# Patient Record
Sex: Male | Born: 1985
Health system: Southern US, Community
[De-identification: ages and names within clinical notes are randomized; demographics above are authoritative.]

---

## 2019-09-27 ENCOUNTER — Emergency Department (HOSPITAL_BASED_OUTPATIENT_CLINIC_OR_DEPARTMENT_OTHER)
Admission: EM | Admit: 2019-09-27 | Discharge: 2019-09-27 | Disposition: A | Discharge status: Home / Self Care | Payer: BC Managed Care – PPO | Attending: Emergency Medicine | Admitting: Emergency Medicine

## 2019-09-27 ENCOUNTER — Encounter (HOSPITAL_BASED_OUTPATIENT_CLINIC_OR_DEPARTMENT_OTHER): Payer: Self-pay | Admitting: *Deleted

## 2019-09-27 ENCOUNTER — Other Ambulatory Visit: Payer: Self-pay

## 2019-09-27 DIAGNOSIS — Y9389 Activity, other specified: Secondary | ICD-10-CM | POA: Diagnosis not present

## 2019-09-27 DIAGNOSIS — Y998 Other external cause status: Secondary | ICD-10-CM | POA: Diagnosis not present

## 2019-09-27 DIAGNOSIS — Y92009 Unspecified place in unspecified non-institutional (private) residence as the place of occurrence of the external cause: Secondary | ICD-10-CM | POA: Insufficient documentation

## 2019-09-27 DIAGNOSIS — E860 Dehydration: Secondary | ICD-10-CM | POA: Diagnosis not present

## 2019-09-27 DIAGNOSIS — S3739XA Other injury of urethra, initial encounter: Secondary | ICD-10-CM | POA: Diagnosis not present

## 2019-09-27 DIAGNOSIS — R3911 Hesitancy of micturition: Secondary | ICD-10-CM | POA: Insufficient documentation

## 2019-09-27 DIAGNOSIS — X58XXXA Exposure to other specified factors, initial encounter: Secondary | ICD-10-CM | POA: Diagnosis not present

## 2019-09-27 LAB — COMPREHENSIVE METABOLIC PANEL
ALT: 23 U/L (ref 0–44)
AST: 14 U/L — ABNORMAL LOW (ref 15–41)
Albumin: 4.4 g/dL (ref 3.5–5.0)
Alkaline Phosphatase: 66 U/L (ref 38–126)
Anion gap: 10 (ref 5–15)
BUN: 16 mg/dL (ref 6–20)
CO2: 27 mmol/L (ref 22–32)
Calcium: 9.5 mg/dL (ref 8.9–10.3)
Chloride: 101 mmol/L (ref 98–111)
Creatinine, Ser: 1.17 mg/dL (ref 0.61–1.24)
GFR calc Af Amer: >60 mL/min (ref >60)
GFR calc non Af Amer: >60 mL/min (ref >60)
Glucose, Bld: 128 mg/dL — ABNORMAL HIGH (ref 70–99)
Potassium: 3.9 mmol/L (ref 3.5–5.1)
Sodium: 138 mmol/L (ref 135–145)
Total Bilirubin: 0.9 mg/dL (ref 0.3–1.2)
Total Protein: 7.8 g/dL (ref 6.5–8.1)

## 2019-09-27 LAB — CBC WITH DIFFERENTIAL/PLATELET
Abs Immature Granulocytes: 0.02 10*3/uL (ref 0.00–0.07)
Basophils Absolute: 0 10*3/uL (ref 0.0–0.1)
Basophils Relative: 1 %
Eosinophils Absolute: 0.1 10*3/uL (ref 0.0–0.5)
Eosinophils Relative: 2 %
HCT: 48 % (ref 39.0–52.0)
Hemoglobin: 16.1 g/dL (ref 13.0–17.0)
Immature Granulocytes: 0 %
Lymphocytes Relative: 32 %
Lymphs Abs: 1.9 10*3/uL (ref 0.7–4.0)
MCH: 30.6 pg (ref 26.0–34.0)
MCHC: 33.5 g/dL (ref 30.0–36.0)
MCV: 91.3 fL (ref 80.0–100.0)
Monocytes Absolute: 0.5 10*3/uL (ref 0.1–1.0)
Monocytes Relative: 8 %
Neutro Abs: 3.4 10*3/uL (ref 1.7–7.7)
Neutrophils Relative %: 57 %
Platelets: 221 10*3/uL (ref 150–400)
RBC: 5.26 MIL/uL (ref 4.22–5.81)
RDW: 13.1 % (ref 11.5–15.5)
WBC: 5.9 10*3/uL (ref 4.0–10.5)
nRBC: 0 % (ref 0.0–0.2)

## 2019-09-27 LAB — URINALYSIS, ROUTINE W REFLEX MICROSCOPIC
Glucose, UA: NEGATIVE mg/dL
Hgb urine dipstick: NEGATIVE
Ketones, ur: >80 mg/dL — AB
Leukocytes,Ua: NEGATIVE
Nitrite: NEGATIVE
Protein, ur: NEGATIVE mg/dL
Specific Gravity, Urine: 1.025 (ref 1.005–1.030)
pH: 6 (ref 5.0–8.0)

## 2019-09-27 LAB — CK: Total CK: 101 U/L (ref 49–397)

## 2019-09-27 MED ORDER — SODIUM CHLORIDE 0.9 % IV BOLUS
1000.0000 mL | Freq: Once | INTRAVENOUS | Status: AC
  Administered 2019-09-27: 1000 mL via INTRAVENOUS

## 2019-09-27 NOTE — ED Provider Notes (Signed)
[facility] EMERGENCY DEPARTMENT Provider Note   CSN: [id number] Arrival date & time: 09/27/19  1304     History No chief complaint on file.   Darren Cruz is a 34 y.o. male.  HPI Patient reports he has a feeling that is difficult to get his urine stream started and out.  He reports he gets to feeling an urgency to urinate but feels like it is hard to pass urine.  He reports that yesterday he, fell getting out of the shower and got his penis caught between something and kind of crushed it.  He reports he thinks it was erect when that happened.  He reports there was pain at the time.  He might of had some blood, to the penis then but he reports he drank some fluids and his urine has been clear since that time.  He has not had fevers or chills or abdominal pain.  He denies he has had an erection since that episode occurred.  He reports his penis is not really painful anymore may be a little uncomfortable.  No pain in the testicles.  He denies he has any history of diabetes or hypertension.  He denies flank pain.    History reviewed. No pertinent past medical history.  There are no problems to display for this patient.   History reviewed. No pertinent surgical history.     No family history on file.  Social History   Tobacco Use  . Smoking status: Never Smoker  . Smokeless tobacco: Never Used  Substance Use Topics  . Alcohol use: Not Currently  . Drug use: Never    Home Medications Prior to Admission medications   Not on File    Allergies    Patient has no known allergies.  Review of Systems   Review of Systems  10 systems reviewed and negative except as per HPI  Physical Exam Updated Vital Signs BP 117/69 (BP Location: Right Arm)   Pulse 80   Temp 98.7 F (37.1 C) (Oral)   Resp 18   Ht 6\' 3"  (1.905 m)   Wt (!) 102.1 kg   SpO2 99%   BMI 28.12 kg/m   Physical Exam Constitutional:      Appearance: Normal appearance.  Eyes:     Extraocular  Movements: Extraocular movements intact.     Conjunctiva/sclera: Conjunctivae normal.  Cardiovascular:     Rate and Rhythm: Normal rate and regular rhythm.  Pulmonary:     Effort: Pulmonary effort is normal.     Breath sounds: Normal breath sounds.  Abdominal:     General: There is no distension.     Palpations: Abdomen is soft.     Tenderness: There is no abdominal tenderness. There is no guarding.  Genitourinary:    Comments: No inguinal tenderness or mass.  Penis normal.  No ecchymoses or areas of hematoma.  Glans normal with normal urethral meatus.  No appearance of tear or any blood or drainage from the meatus.  Testicles are nontender and smooth.  No scrotal edema. Musculoskeletal:        General: No swelling. Normal range of motion.     Right lower leg: No edema.     Left lower leg: No edema.  Skin:    General: Skin is warm and dry.  Neurological:     General: No focal deficit present.     Mental Status: He is alert and oriented to person, place, and time.     Coordination: Coordination  normal.  Psychiatric:        Mood and Affect: Mood normal.     ED Results / Procedures / Treatments   Labs (all labs ordered are listed, but only abnormal results are displayed) Labs Reviewed  URINALYSIS, ROUTINE W REFLEX MICROSCOPIC - Abnormal; Notable for the following components:      Result Value   Color, Urine AMBER (*)    Bilirubin Urine SMALL (*)    Ketones, ur >80 (*)    All other components within normal limits  COMPREHENSIVE METABOLIC PANEL - Abnormal; Notable for the following components:   Glucose, Bld 128 (*)    AST 14 (*)    All other components within normal limits  CBC WITH DIFFERENTIAL/PLATELET  CK  RPR  GC/CHLAMYDIA PROBE AMP ([place]) NOT AT [facility]    EKG None  Radiology No results found.  Procedures Procedures (including critical care time)  Medications Ordered in ED Medications  sodium chloride 0.9 % bolus 1,000 mL (1,000 mLs Intravenous New  Bag/Given 09/27/19 1809)    ED Course  I have reviewed the triage vital signs and the nursing notes.  Pertinent labs & imaging results that were available during my care of the patient were reviewed by me and considered in my medical decision making (see chart for details).    MDM Rules/Calculators/A&P                          Patient describes some decreased urine output and needing to strain to pass urine.  He described an episode of possible crush injury.  The penile exam is normal.  There is no evidence of a fracture.  There is no hematoma or skin changes.  Patient is nontender to exam.  Notably, urine has greater than 80 ketones.  Patient is clinically well but will check a comprehensive metabolic panel, CBC and CK to rule out any metabolic issues.  Patient is clinically well in appearance with a normal physical examination. Urinalysis did show elevated ketones. Patient is rehydrated with 1 L fluids. Normal renal function, CBC and CK. Patient described falling and injuring his penis yesterday. Physical exam of the penis is normal. There are no ecchymotic or swelling of the shaft. Shaft of penis is completely normal. I do not suspect penile fracture given no findings of bruising or swelling whatsoever. Urethral meatus is completely normal without any irritation evidence of blood or tear.  At this time, patient is safe for discharge with hydrating and rest. He is an otherwise healthy 34 year old. GC chlamydia has been added on but at this time symptoms are not highly suggestive. Will not opt to treat empirically based on history and exam.  Final Clinical Impression(s) / ED Diagnoses Final diagnoses:  Urinary hesitancy  Injury of penile urethra  Dehydration    Rx / DC Orders ED Discharge Orders    None       Arby Barrette, MD 09/27/19 1900

## 2019-09-27 NOTE — ED Triage Notes (Signed)
Difficulty getting his urinary stream started. He crushed his penis during a fall per pt.

## 2019-09-27 NOTE — Discharge Instructions (Signed)
1. You appear to be slightly dehydrated. Rest and get plenty of fluids over the next 24 hours. 2. You expressed difficulty urinating. The examination of your penis is normal at this time. Your urine does not show signs of infection. Gonorrhea and Chlamydia tests have been added onto your urine specimen, these results should be available in several days. Your examination did not suggest an infection at this time. 3. If you are feeling of urinary difficulty or penile discomfort has not resolved within the next 1 to 2 days with hydrating and rest, you should schedule a follow-up appointment with the urologist. The contact information for [facility] are included in your discharge instructions.

## 2019-09-28 LAB — GC/CHLAMYDIA PROBE AMP (~~LOC~~) NOT AT ARMC
Chlamydia: NEGATIVE
Comment: NEGATIVE
Comment: NORMAL
Neisseria Gonorrhea: NEGATIVE

## 2019-09-28 LAB — RPR: RPR Ser Ql: NONREACTIVE

## 2019-10-05 ENCOUNTER — Emergency Department (HOSPITAL_BASED_OUTPATIENT_CLINIC_OR_DEPARTMENT_OTHER): Payer: BC Managed Care – PPO

## 2019-10-05 ENCOUNTER — Encounter (HOSPITAL_BASED_OUTPATIENT_CLINIC_OR_DEPARTMENT_OTHER): Payer: Self-pay

## 2019-10-05 ENCOUNTER — Other Ambulatory Visit: Payer: Self-pay

## 2019-10-05 ENCOUNTER — Emergency Department (HOSPITAL_BASED_OUTPATIENT_CLINIC_OR_DEPARTMENT_OTHER)
Admission: EM | Admit: 2019-10-05 | Discharge: 2019-10-05 | Disposition: A | Discharge status: Home / Self Care | Payer: BC Managed Care – PPO | Attending: Emergency Medicine | Admitting: Emergency Medicine

## 2019-10-05 DIAGNOSIS — H6123 Impacted cerumen, bilateral: Secondary | ICD-10-CM | POA: Insufficient documentation

## 2019-10-05 DIAGNOSIS — J039 Acute tonsillitis, unspecified: Secondary | ICD-10-CM

## 2019-10-05 DIAGNOSIS — J029 Acute pharyngitis, unspecified: Secondary | ICD-10-CM | POA: Insufficient documentation

## 2019-10-05 LAB — CBC WITH DIFFERENTIAL/PLATELET
Abs Immature Granulocytes: 0.03 10*3/uL (ref 0.00–0.07)
Basophils Absolute: 0.1 10*3/uL (ref 0.0–0.1)
Basophils Relative: 1 %
Eosinophils Absolute: 0.1 10*3/uL (ref 0.0–0.5)
Eosinophils Relative: 1 %
HCT: 48.1 % (ref 39.0–52.0)
Hemoglobin: 15.9 g/dL (ref 13.0–17.0)
Immature Granulocytes: 0 %
Lymphocytes Relative: 21 %
Lymphs Abs: 1.8 10*3/uL (ref 0.7–4.0)
MCH: 30.1 pg (ref 26.0–34.0)
MCHC: 33.1 g/dL (ref 30.0–36.0)
MCV: 91.1 fL (ref 80.0–100.0)
Monocytes Absolute: 0.8 10*3/uL (ref 0.1–1.0)
Monocytes Relative: 9 %
Neutro Abs: 5.8 10*3/uL (ref 1.7–7.7)
Neutrophils Relative %: 68 %
Platelets: 216 10*3/uL (ref 150–400)
RBC: 5.28 MIL/uL (ref 4.22–5.81)
RDW: 13.1 % (ref 11.5–15.5)
WBC: 8.5 10*3/uL (ref 4.0–10.5)
nRBC: 0 % (ref 0.0–0.2)

## 2019-10-05 LAB — BASIC METABOLIC PANEL
Anion gap: 12 (ref 5–15)
BUN: 8 mg/dL (ref 6–20)
CO2: 28 mmol/L (ref 22–32)
Calcium: 9.7 mg/dL (ref 8.9–10.3)
Chloride: 98 mmol/L (ref 98–111)
Creatinine, Ser: 1.29 mg/dL — ABNORMAL HIGH (ref 0.61–1.24)
GFR calc Af Amer: >60 mL/min (ref >60)
GFR calc non Af Amer: >60 mL/min (ref >60)
Glucose, Bld: 100 mg/dL — ABNORMAL HIGH (ref 70–99)
Potassium: 4.4 mmol/L (ref 3.5–5.1)
Sodium: 138 mmol/L (ref 135–145)

## 2019-10-05 LAB — GROUP A STREP BY PCR: Group A Strep by PCR: NOT DETECTED

## 2019-10-05 MED ORDER — IOHEXOL 300 MG/ML  SOLN
100.0000 mL | Freq: Once | INTRAMUSCULAR | Status: AC | PRN
  Administered 2019-10-05: 75 mL via INTRAVENOUS

## 2019-10-05 MED ORDER — SODIUM CHLORIDE 0.9 % IV SOLN: INTRAVENOUS | Status: DC | PRN

## 2019-10-05 MED ORDER — PROBIOTIC 1-250 BILLION-MG PO CAPS: 1.0000 | ORAL_CAPSULE | Freq: Every day | ORAL | 0 refills | Status: AC

## 2019-10-05 MED ORDER — CLINDAMYCIN PHOSPHATE 600 MG/50ML IV SOLN
600.0000 mg | Freq: Once | INTRAVENOUS | Status: AC
  Administered 2019-10-05: 600 mg via INTRAVENOUS
  Filled 2019-10-05: qty 50

## 2019-10-05 MED ORDER — DEXAMETHASONE SODIUM PHOSPHATE 10 MG/ML IJ SOLN
10.0000 mg | Freq: Once | INTRAMUSCULAR | Status: AC
  Administered 2019-10-05: 10 mg via INTRAVENOUS
  Filled 2019-10-05: qty 1

## 2019-10-05 MED ORDER — CLINDAMYCIN HCL 150 MG PO CAPS
450.0000 mg | ORAL_CAPSULE | Freq: Three times a day (TID) | ORAL | 0 refills | Status: AC
Start: 2019-10-05 — End: 2019-10-15

## 2019-10-05 MED FILL — CLINDAMYCIN HCL 150 MG CAPS: 150 | 10 days supply | Qty: 90 | Fill #0

## 2019-10-05 NOTE — ED Triage Notes (Signed)
Pt arrives stating "I need a tonsil stone removed" pt reports sore throat X 2-3 days.

## 2019-10-05 NOTE — ED Notes (Signed)
C/o sore throat, x 4 days, denies any cough, denies having any fevers, upon visual examination, left upper tonsillar area is swollen and red, having pain in left ear as well. Throat swab was also obtained

## 2019-10-05 NOTE — ED Provider Notes (Signed)
[facility] EMERGENCY DEPARTMENT Provider Note   CSN: [id number] Arrival date & time: 10/05/19  1129     History Chief Complaint  Patient presents with  . Sore Throat    Darren Cruz is a 34 y.o. male.  HPI  34 year old male presents with a 4-day history of sore throat.  He notes the pain is worse on the left side than the right side.  He states that he feels that it is swollen and that has worsened over the last 2 days.  He denies difficulty swallowing or breathing.  He denies any associated cough, fevers, chills.  He denies any rhinorrhea, nasal congestion.  He does feel the pain radiates to the left ear.  He denies any chest pain, shortness of breath.     History reviewed. No pertinent past medical history.  There are no problems to display for this patient.   History reviewed. No pertinent surgical history.     No family history on file.  Social History   Tobacco Use  . Smoking status: Never Smoker  . Smokeless tobacco: Never Used  Substance Use Topics  . Alcohol use: Not Currently  . Drug use: Never    Home Medications Prior to Admission medications   Not on File    Allergies    Patient has no known allergies.  Review of Systems   Review of Systems  Constitutional: Negative for chills and fever.  HENT: Positive for sore throat. Negative for rhinorrhea, sinus pressure and trouble swallowing.   Respiratory: Negative for shortness of breath.   Cardiovascular: Negative for chest pain.  Gastrointestinal: Negative for abdominal pain, nausea and vomiting.    Physical Exam Updated Vital Signs BP 104/73 (BP Location: Right Arm)   Pulse 88   Temp 98.5 F (36.9 C) (Oral)   Resp 18   Ht 6\' 3"  (1.905 m)   Wt 102.1 kg   SpO2 97%   BMI 28.12 kg/m   Physical Exam Vitals and nursing note reviewed.  Constitutional:      Appearance: He is well-developed.  HENT:     Head: Normocephalic and atraumatic.     Right Ear: Ear canal normal. There is  impacted cerumen.     Left Ear: Ear canal normal. There is impacted cerumen.     Nose: Nose normal.     Mouth/Throat:     Lips: Pink.     Mouth: Mucous membranes are moist.     Tongue: No lesions. Tongue does not deviate from midline.     Pharynx: Uvula midline.     Tonsils: No tonsillar exudate. 1+ on the right. 2+ on the left.  Eyes:     Conjunctiva/sclera: Conjunctivae normal.  Cardiovascular:     Rate and Rhythm: Normal rate and regular rhythm.     Heart sounds: Normal heart sounds. No murmur heard.   Pulmonary:     Effort: Pulmonary effort is normal. No respiratory distress.     Breath sounds: Normal breath sounds. No wheezing or rales.  Abdominal:     General: Bowel sounds are normal. There is no distension.     Palpations: Abdomen is soft.     Tenderness: There is no abdominal tenderness.  Musculoskeletal:        General: No tenderness or deformity. Normal range of motion.     Cervical back: Neck supple.  Skin:    General: Skin is warm and dry.     Findings: No erythema or rash.  Neurological:  Mental Status: He is alert and oriented to person, place, and time.  Psychiatric:        Behavior: Behavior normal.     ED Results / Procedures / Treatments   Labs (all labs ordered are listed, but only abnormal results are displayed) Labs Reviewed  GROUP A STREP BY PCR  CBC WITH DIFFERENTIAL/PLATELET  BASIC METABOLIC PANEL    EKG None  Radiology No results found.  Procedures Procedures (including critical care time)  Medications Ordered in ED Medications  clindamycin (CLEOCIN) IVPB 600 mg (has no administration in time range)  dexamethasone (DECADRON) injection 10 mg (has no administration in time range)    ED Course  I have reviewed the triage vital signs and the nursing notes.  Pertinent labs & imaging results that were available during my care of the patient were reviewed by me and considered in my medical decision making (see chart for details).      MDM Rules/Calculators/A&P                            Patient presents with sore throat.  On physical exam he does have asymmetric tonsillar hypertrophy with left greater than right.  He is talking in full sentences and tolerating his secretions without difficulty.  He has no muffled voice. He denies any difficulty swallowing or breathing.  Blood work unremarkable, normal white count.  Patient given a round of clindamycin and dexamethasone in the ED.  He had a CT scan of the neck which showed asymmetric tonsillar hypertrophy with some stranding.  However there was no evidence of peritonsillar abscess on CT.  On reevaluation patient continues to be well-appearing, no acute distress, nontoxic, non-lethargic.  He continues to deny any difficulty swallowing or breathing.  Will discharge with clindamycin.  Encourage follow-up with ENT in several days.  Given very strict return precautions and patient expressed understanding.  He is agreeable with plan.  Patient ready and stable for discharge.   At this time there does not appear to be any evidence of an acute emergency medical condition and the patient appears stable for discharge with appropriate outpatient follow up.Diagnosis was discussed with patient who verbalizes understanding and is agreeable to discharge. Pt case discussed with Dr. Dalene Seltzer who agrees with my plan.     Final Clinical Impression(s) / ED Diagnoses Final diagnoses:  None    Rx / DC Orders ED Discharge Orders    None       Rueben Bash 10/05/19 2303    Alvira Monday, MD 10/05/19 2316

## 2019-10-05 NOTE — Discharge Instructions (Signed)
Take clindamycin as prescribed.  Take a daily probiotic.  Please follow-up with ear nose throat within the next 3 to 5 days for continued evaluation.  Return to the emergency room immediately for new or worsening symptoms or concerns, such as increased swelling, difficulty swallowing or breathing or any concerns at all.

## 2020-10-25 IMAGING — CT CT NECK W/ CM
3 of 4 series · 14 of 33 positions shown, 17 images · IV contrast (Omnipaque)
Comparison: None.

CLINICAL DATA: Sore throat

EXAM:
CT NECK WITH CONTRAST
TECHNIQUE: Multidetector CT imaging of the neck was performed using the
standard protocol following the bolus administration of intravenous
contrast.
CONTRAST:  75mL OMNIPAQUE IOHEXOL 300 MG/ML  SOLN

[Series 6: sag neck · sagittal · 0.49mm/px · 5 of 84 slices shown, 6 images]
[im 28/84  bone]
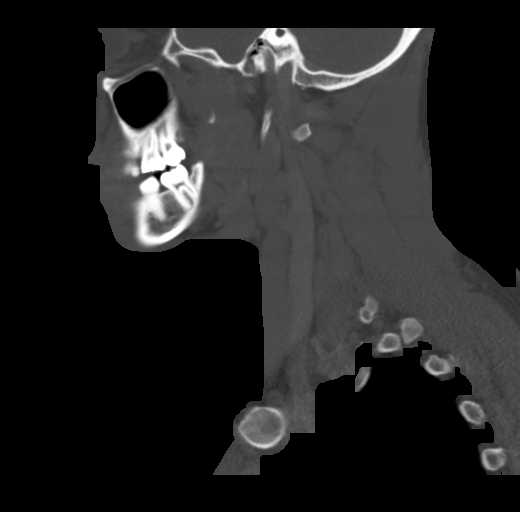
[im 35/84  bone]
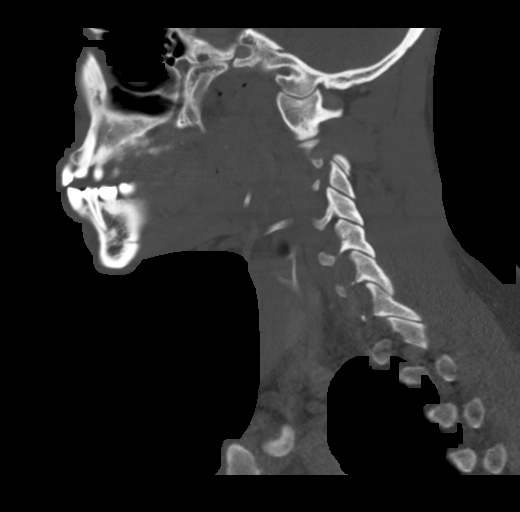
[im 42/84  soft-tissue]
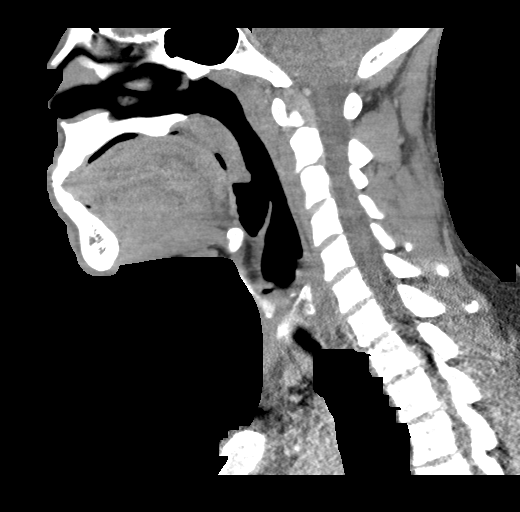
[im 42/84  bone]
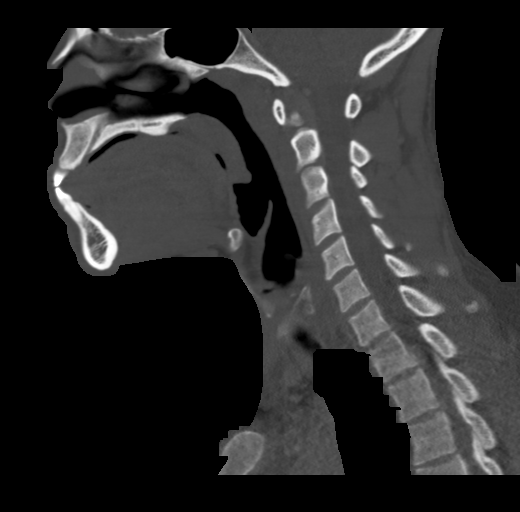
[im 49/84  bone]
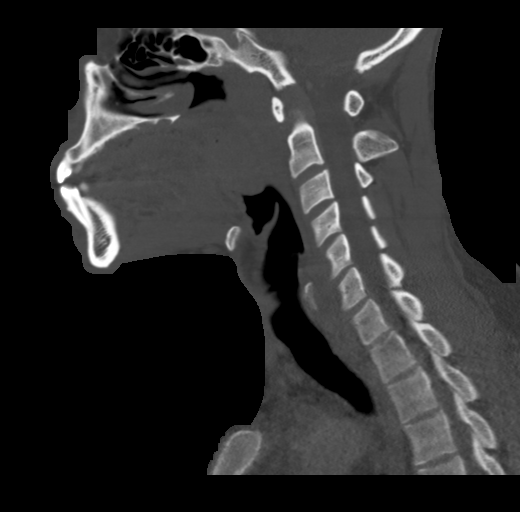
[im 56/84  bone]
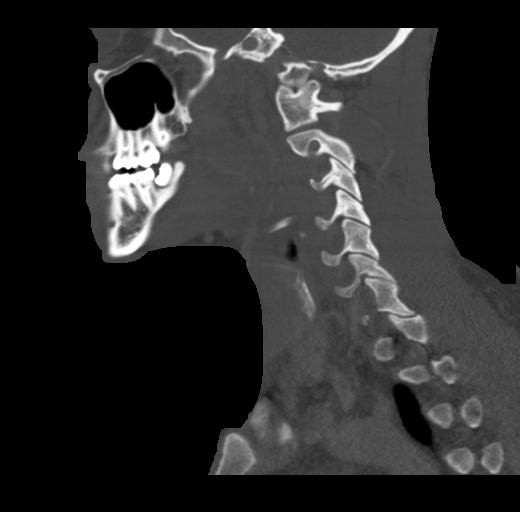

[Series 7: cor neck · coronal · 0.35mm/px · 3 of 111 slices shown]
[im 23/111  bone]
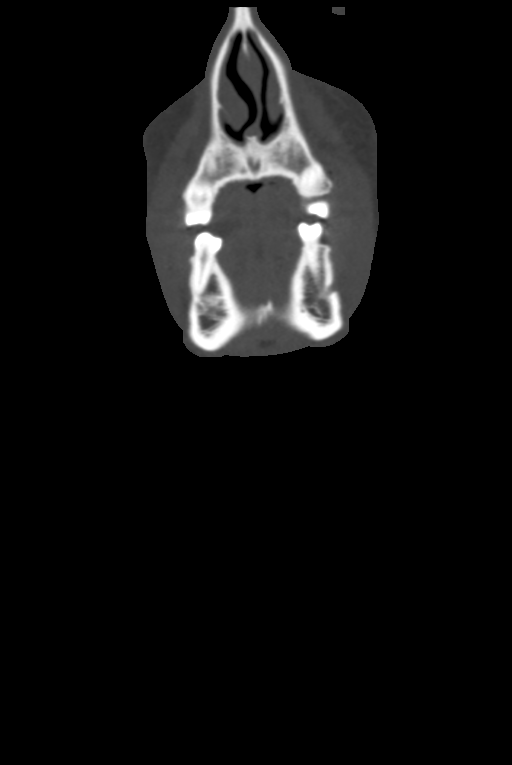
[im 45/111  bone]
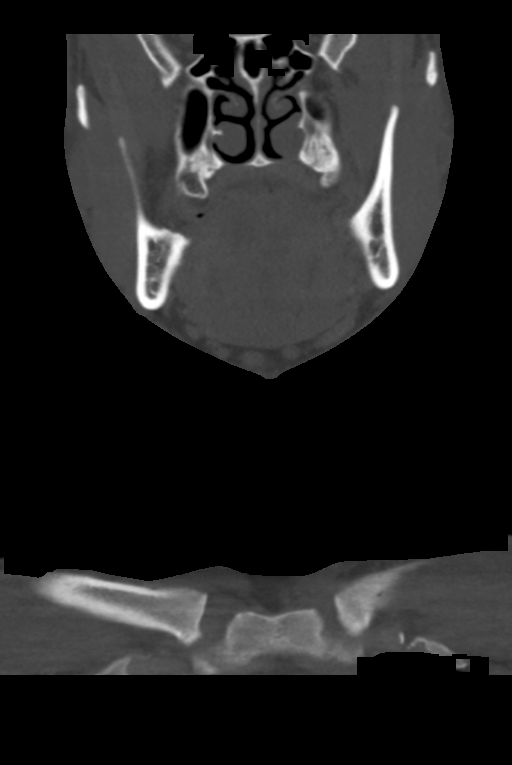
[im 67/111  bone]
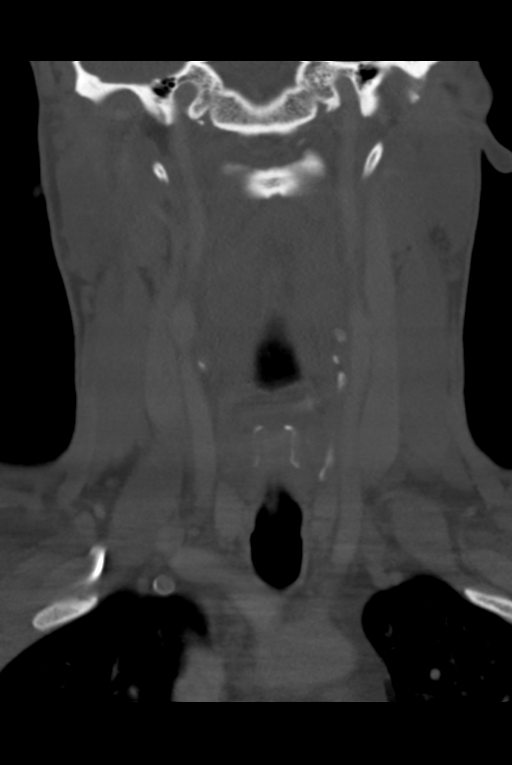

[Series 8: orthogonal ax · axial · 0.39mm/px · z∈[-272,-81]mm · 6 of 139 slices shown, 8 images]
[im 20/139  soft-tissue]
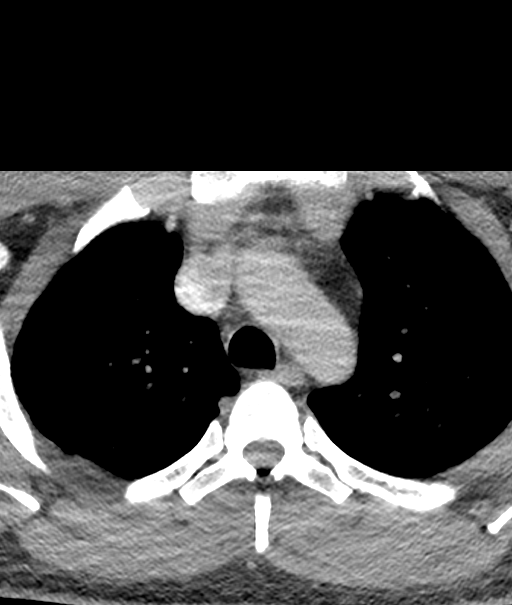
[im 20/139  bone]
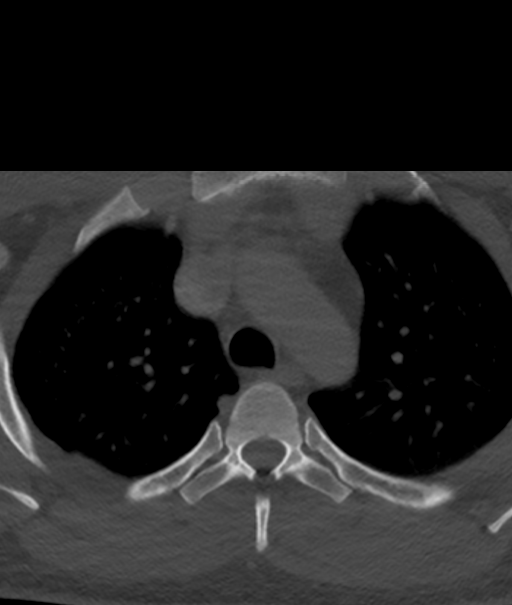
[im 40/139  bone]
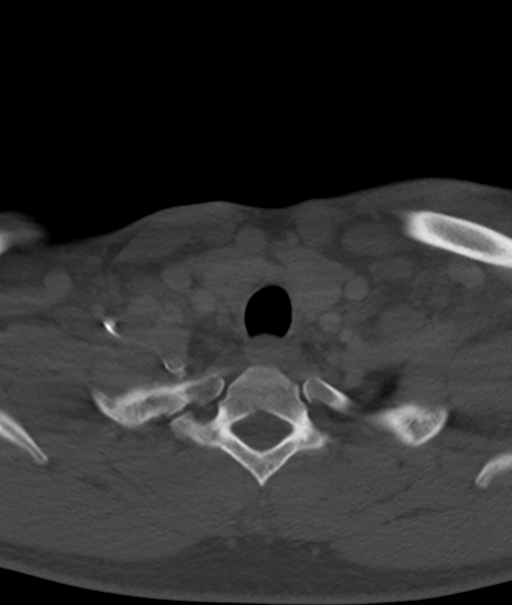
[im 60/139  bone]
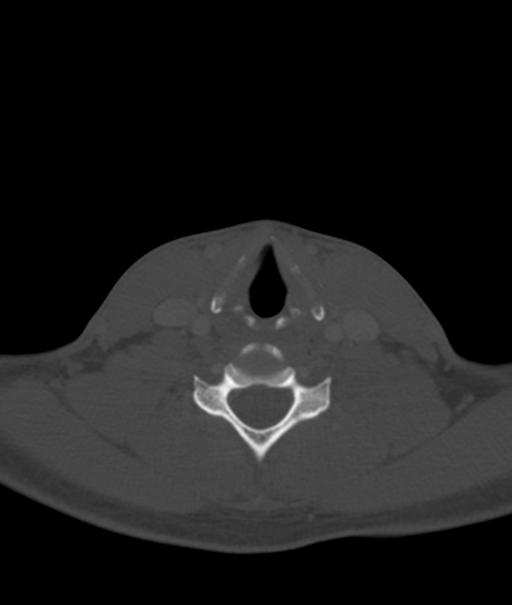
[im 79/139  bone]
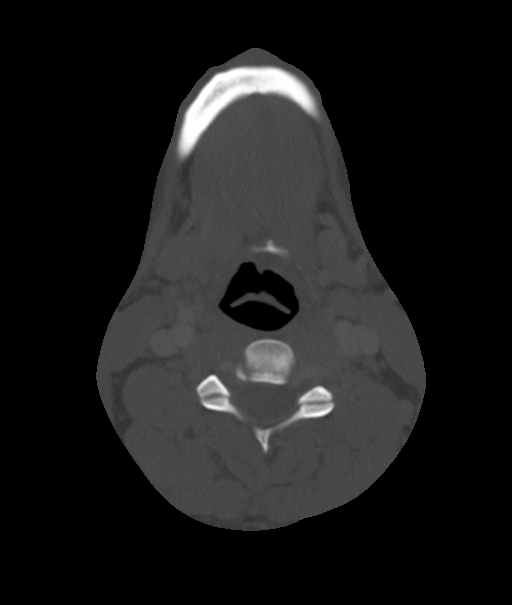
[im 99/139  soft-tissue]
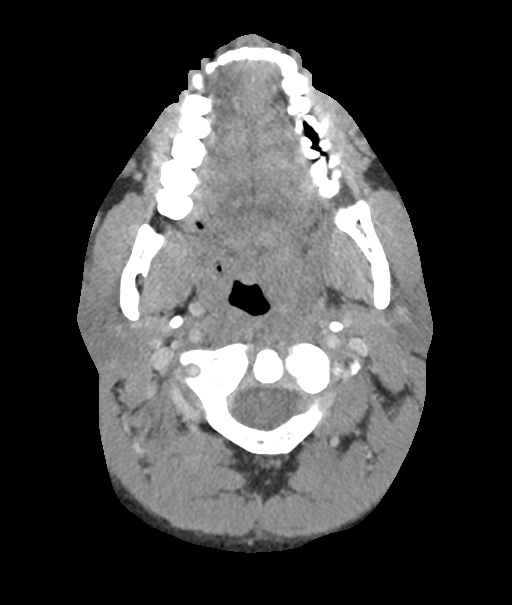
[im 99/139  bone]
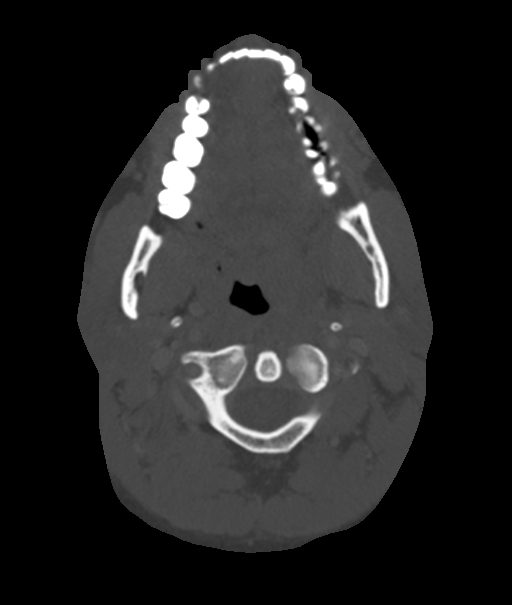
[im 119/139  bone]
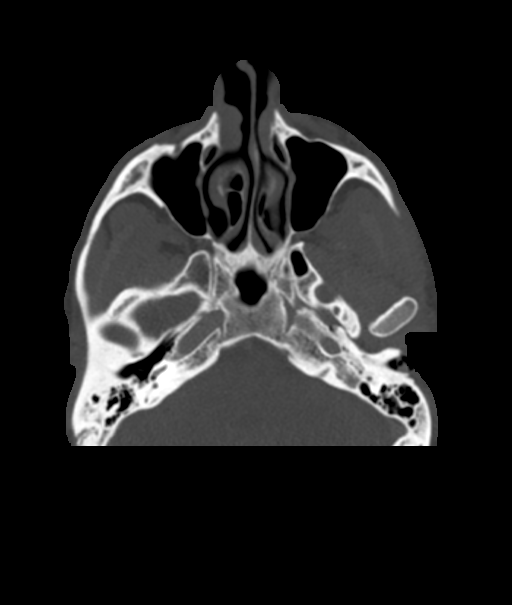

[14 of 33 positions shown; findings below may reference images not displayed]

FINDINGS: Pharynx and larynx: Nasopharynx is unremarkable. Oral cavity is
unremarkable. Asymmetric prominence of the left palatine tonsil with
mild infiltration of the adjacent parapharyngeal fat. No evidence of
peritonsillar abscess. Hypopharynx, supraglottic larynx, and larynx
are unremarkable. The airway is patent.

Salivary glands: Unremarkable.

Thyroid: Normal.

Lymph nodes: Mildly enlarged left level 2 node measuring 1.8 cm is
probably reactive.

Vascular: Major neck vessels are patent.

Limited intracranial: No abnormal enhancement.

Visualized orbits: Unremarkable.

Mastoids and visualized paranasal sinuses: Aerated.

Skeleton: No significant abnormality.

Upper chest: Included upper lungs are clear.

Other: None.
IMPRESSION: Asymmetric prominence of the left palatine tonsil with mild
parapharyngeal inflammatory changes. No evidence of peritonsillar
abscess.
